# Patient Record
Sex: Female | Born: 1975
Health system: Southern US, Community
[De-identification: ages and names within clinical notes are randomized; demographics above are authoritative.]

## PROBLEM LIST (undated history)

## (undated) DIAGNOSIS — I509 Heart failure, unspecified: Secondary | ICD-10-CM

## (undated) DIAGNOSIS — I471 Supraventricular tachycardia: Secondary | ICD-10-CM

## (undated) HISTORY — DX: Heart failure, unspecified: I50.9

## (undated) HISTORY — DX: Supraventricular tachycardia: I47.1

---

## 1999-08-10 DIAGNOSIS — I509 Heart failure, unspecified: Secondary | ICD-10-CM

## 1999-08-10 HISTORY — DX: Heart failure, unspecified: I50.9

## 2010-11-09 DIAGNOSIS — I471 Supraventricular tachycardia, unspecified: Secondary | ICD-10-CM

## 2010-11-09 HISTORY — DX: Supraventricular tachycardia, unspecified: I47.10

## 2010-11-09 HISTORY — DX: Supraventricular tachycardia: I47.1

## 2016-10-20 NOTE — Progress Notes (Signed)
Cardiology Office Note    Date:  10/22/2016   ID:  Caitlin Cowan, DOB 08/08/1976, MRN [id number]  PCP:  No primary care provider on file.  Cardiologist:  New - previously followed by [facility] in [place] with Dr. Heron NayKhalil   CC: tachycardia  History of Present Illness:  Caitlin Cowan is a 40 y.o. female with a history of SVT, gestational HTN, and postpartum cardiomyopathy (2000) who presents to clinic for evaluation of recurrent SVT.  Last recurrence of SVT occurred in 2012. Seen at [facility] and 2D ECHO at that time was normal. EF 60%. She was not placed on any medications and did not have any more problems until recently. ECGs from that time reviewed which shows SVT.  She does not follow with a regular medical doctor regularly but have seen has been seen over the past two years. She has been told her BP has run high in the past but does not take anything for this.   This past Friday she was reaching to into dryer to do laundry and had episode of palpitations associated with chest tightness and SOB. No dizziness or syncope. She tried a vagal maneuver which helped transiently but came right back. Total of 3 hours.   This Tuesday she had another episode at work which lasted about 30 minutes. 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   She has been exercising over the last couple months with no chest pain or SOB. Has been worried about exercising because she doesn't want to trigger another event.   No LE edema, orthopnea or PND.   Heart disease in paternal GF and uncle (died on MI in 4140s).   Past Medical History:  Diagnosis Date  . CHF (congestive heart failure) (HCC) 08/1999   [facility]  . SVT (supraventricular tachycardia) (HCC) 2012    History reviewed. No pertinent surgical history.  Current Medications: No outpatient prescriptions prior to visit.   No facility-administered medications prior to visit.       Allergies:   Patient has no known allergies.   Social History   Social History  . Marital status: Married    Spouse name: N/A  . Number of children: 2  . Years of education: N/A   Occupational History  . CMA [employer]   Social History Main Topics  . Smoking status: Never Smoker  . Smokeless tobacco: Former NeurosurgeonUser    Quit date: 10/22/2012  . Alcohol use 2.4 oz/week    4 Shots of liquor per week     Comment: scocially  . Drug use: No  . Sexual activity: Yes   Other Topics Concern  . None   Social History Narrative  . None     Family History:  The patient's family history includes Asthma in her brother; Dementia in her paternal grandmother; Diabetes in her father; Heart attack in her paternal grandfather and paternal uncle; Hyperlipidemia in her mother; Migraines in her brother.     ROS:   Please see the history of present illness.    ROS All other systems reviewed and are negative.   PHYSICAL EXAM:   VS:  BP (!) 160/92   Pulse (!) 115   Ht 5\' 1"  (1.549 m)   Wt 151 lb 6 oz (68.7 kg)   LMP 09/28/2016   BMI 28.60 kg/m    GEN: Well nourished, well developed, in no acute distress  HEENT: normal  Neck: no JVD, carotid bruits, or masses Cardiac: RRR; tachy no murmurs, rubs, or gallops,no edema  Respiratory:  clear to auscultation bilaterally, normal work of breathing GI: soft, nontender, nondistended, + BS MS: no deformity or atrophy  Skin: warm and dry, no rash Neuro:  Alert and Oriented x 3, Strength and sensation are intact Psych: euthymic mood, full affect  Wt Readings from Last 3 Encounters:  10/22/16 151 lb 6 oz (68.7 kg)      Studies/Labs Reviewed:   EKG:  EKG is ordered today.  The ekg ordered today demonstrates sinus tachycardia HR 115  Recent Labs: No results found for requested labs within last 8760 hours.   Lipid Panel No results found for: CHOL, TRIG, HDL, CHOLHDL, VLDL, LDLCALC, LDLDIRECT  Additional studies/ records that  were reviewed today include:  [facility]D ECHO 2012 at [facility] Normal LV function EF 60%, no regional wall motion abnormality, no LVH, no diastolic dysfunction. Right ventricular size and function normal. No valvular abnormalities   ASSESSMENT & PLAN:   SVT: she has a long history of this but has done well until recently on no medications. She also likely has undiagnosed HTN. For this reason, I will start her on Cardizem CD 120mg  daily with 30mg  diltiazem PRN for palpitations. Will udpate 2D ECHO and get basic lab work including CBC, BMET and TSH. If she fails conservative measures, I will refer to EP for evaluation for ablation. Dr. Johney Frame discussed plan with me today and agrees.    HTN: BP elevated today. Will start CCB as above and she will keep a log of BP and call us if consistently running over >140/90  Hx of postpartum CM: EF normal by 2D ECHO in 2012. Will update  Sinus tachycardia: she says her HR has run high since her first pregnancy. HR 115 today. Start CCB as above   Medication Adjustments/Labs and Tests Ordered: Current medicines are reviewed at length with the patient today.  Concerns regarding medicines are outlined above.  Medication changes, Labs and Tests ordered today are listed in the Patient Instructions below. Patient Instructions  Medication Instructions:  Your physician has recommended you make the following change in your medication:  1.  START Cardizem 120 mg taking 1 tablet daily 2.  START Diltiazem 30 mg taking 1 tablet daily AS NEEDED FOR PALPITATIONS   Labwork: TODAY:  BMET, CBC, & TSH  Testing/Procedures: Your physician has requested that you have an echocardiogram. Echocardiography is a painless test that uses sound waves to create images of your heart. It provides your doctor with information about the size and shape of your heart and how well your heart's chambers and valves are working. This procedure takes approximately one hour. There are no  restrictions for this procedure.    Follow-Up: Your physician recommends that you schedule a follow-up appointment in: 11/24/16 ARRIVE AT 7:50 FOR AN 8:00 APPT WITH KATIE Caitlin Sato, PA-C   Any Other Special Instructions Will Be Listed Below (If Applicable).  Your physician has requested that you regularly monitor and record your blood pressure readings at home. Please use the same machine at the same time of day to check your readings and record them to bring to your follow-up visit. If it is continuing to run higher than 140/90 please call our office.   Echocardiogram An echocardiogram, or echocardiography, uses sound waves (ultrasound) to produce an image of your heart. The echocardiogram is simple, painless, obtained within a short period of time, and offers valuable  information to your health care provider. The images from an echocardiogram can provide information such as:  Evidence of coronary artery disease (CAD).  Heart size.  Heart muscle function.  Heart valve function.  Aneurysm detection.  Evidence of a past heart attack.  Fluid buildup around the heart.  Heart muscle thickening.  Assess heart valve function. Tell a health care provider about:  Any allergies you have.  All medicines you are taking, including vitamins, herbs, eye drops, creams, and over-the-counter medicines.  Any problems you or family members have had with anesthetic medicines.  Any blood disorders you have.  Any surgeries you have had.  Any medical conditions you have.  Whether you are pregnant or may be pregnant. What happens before the procedure? No special preparation is needed. Eat and drink normally. What happens during the procedure?  In order to produce an image of your heart, gel will be applied to your chest and a wand-like tool (transducer) will be moved over your chest. The gel will help transmit the sound waves from the transducer. The sound waves will harmlessly bounce off  your heart to allow the heart images to be captured in real-time motion. These images will then be recorded.  You may need an IV to receive a medicine that improves the quality of the pictures. What happens after the procedure? You may return to your normal schedule including diet, activities, and medicines, unless your health care provider tells you otherwise. This information is not intended to replace advice given to you by your health care provider. Make sure you discuss any questions you have with your health care provider. Document Released: 10/23/2000 Document Revised: 06/13/2016 Document Reviewed: 07/03/2013 Elsevier Interactive Patient Education  2017 [employer]     If you need a refill on your cardiac medications before your next appointment, please call your pharmacy.      Signed, Caitlin CrockKathryn Mostyn Varnell, PA-C  10/22/2016 9:01 AM    [facility] [address], [place], [place]  [address] Phone: [phone](336) [phone]; Fax: [phone]([phone]

## 2016-10-22 ENCOUNTER — Ambulatory Visit (INDEPENDENT_AMBULATORY_CARE_PROVIDER_SITE_OTHER): Payer: 59 | Admitting: Physician Assistant

## 2016-10-22 ENCOUNTER — Encounter: Payer: Self-pay | Admitting: Physician Assistant

## 2016-10-22 ENCOUNTER — Encounter (INDEPENDENT_AMBULATORY_CARE_PROVIDER_SITE_OTHER): Payer: Self-pay

## 2016-10-22 VITALS — BP 160/92 | HR 115 | Ht 61.0 in | Wt 151.4 lb

## 2016-10-22 DIAGNOSIS — I10 Essential (primary) hypertension: Secondary | ICD-10-CM | POA: Diagnosis not present

## 2016-10-22 DIAGNOSIS — I471 Supraventricular tachycardia: Secondary | ICD-10-CM

## 2016-10-22 DIAGNOSIS — R000 Tachycardia, unspecified: Secondary | ICD-10-CM

## 2016-10-22 DIAGNOSIS — O903 Peripartum cardiomyopathy: Secondary | ICD-10-CM

## 2016-10-22 LAB — CBC
HEMATOCRIT: 41.5 % (ref 35.0–45.0)
Hemoglobin: 14.5 g/dL (ref 11.7–15.5)
MCH: 29.7 pg (ref 27.0–33.0)
MCHC: 34.9 g/dL (ref 32.0–36.0)
MCV: 84.9 fL (ref 80.0–100.0)
MPV: 10.6 fL (ref 7.5–12.5)
PLATELETS: 270 10*3/uL (ref 140–400)
RBC: 4.89 MIL/uL (ref 3.80–5.10)
RDW: 13.9 % (ref 11.0–15.0)
WBC: 6.8 10*3/uL (ref 3.8–10.8)

## 2016-10-22 MED ORDER — DILTIAZEM HCL 30 MG PO TABS: ORAL_TABLET | ORAL | 3 refills | Status: DC

## 2016-10-22 MED ORDER — DILTIAZEM HCL ER COATED BEADS 120 MG PO CP24: 120.0000 mg | ORAL_CAPSULE | Freq: Every day | ORAL | 3 refills | Status: DC

## 2016-10-22 NOTE — Patient Instructions (Addendum)
Medication Instructions:  Your physician has recommended you make the following change in your medication:  1.  START Cardizem 120 mg taking 1 tablet daily 2.  START Diltiazem 30 mg taking 1 tablet daily AS NEEDED FOR PALPITATIONS   Labwork: TODAY:  BMET, CBC, & TSH  Testing/Procedures: Your physician has requested that you have an echocardiogram. Echocardiography is a painless test that uses sound waves to create images of your heart. It provides your doctor with information about the size and shape of your heart and how well your heart's chambers and valves are working. This procedure takes approximately one hour. There are no restrictions for this procedure.    Follow-Up: Your physician recommends that you schedule a follow-up appointment in: 11/24/16 ARRIVE AT 7:50 FOR AN 8:00 APPT WITH KATIE THOMPSON, PA-C   Any Other Special Instructions Will Be Listed Below (If Applicable).  Your physician has requested that you regularly monitor and record your blood pressure readings at home. Please use the same machine at the same time of day to check your readings and record them to bring to your follow-up visit. If it is continuing to run higher than 140/90 please call our office.   Echocardiogram An echocardiogram, or echocardiography, uses sound waves (ultrasound) to produce an image of your heart. The echocardiogram is simple, painless, obtained within a short period of time, and offers valuable information to your health care provider. The images from an echocardiogram can provide information such as:  Evidence of coronary artery disease (CAD).  Heart size.  Heart muscle function.  Heart valve function.  Aneurysm detection.  Evidence of a past heart attack.  Fluid buildup around the heart.  Heart muscle thickening.  Assess heart valve function. Tell a health care provider about:  Any allergies you have.  All medicines you are taking, including vitamins, herbs, eye drops,  creams, and over-the-counter medicines.  Any problems you or family members have had with anesthetic medicines.  Any blood disorders you have.  Any surgeries you have had.  Any medical conditions you have.  Whether you are pregnant or may be pregnant. What happens before the procedure? No special preparation is needed. Eat and drink normally. What happens during the procedure?  In order to produce an image of your heart, gel will be applied to your chest and a wand-like tool (transducer) will be moved over your chest. The gel will help transmit the sound waves from the transducer. The sound waves will harmlessly bounce off your heart to allow the heart images to be captured in real-time motion. These images will then be recorded.  You may need an IV to receive a medicine that improves the quality of the pictures. What happens after the procedure? You may return to your normal schedule including diet, activities, and medicines, unless your health care provider tells you otherwise. This information is not intended to replace advice given to you by your health care provider. Make sure you discuss any questions you have with your health care provider. Document Released: 10/23/2000 Document Revised: 06/13/2016 Document Reviewed: 07/03/2013 Elsevier Interactive Patient Education  2017 [employer]     If you need a refill on your cardiac medications before your next appointment, please call your pharmacy.

## 2016-10-23 NOTE — Addendum Note (Signed)
Addended by: Burnetta SabinWITTY, Kyliana Standen K on: 10/23/2016 12:12 PM   Modules accepted: Orders

## 2016-10-24 LAB — CMP14+CBC/D/PLT+TSH
ALBUMIN: 4.4 g/dL (ref 3.5–5.5)
ALK PHOS: 58 IU/L (ref 39–117)
ALT: 27 IU/L (ref 0–32)
AST: 21 IU/L (ref 0–40)
Albumin/Globulin Ratio: 1.7 (ref 1.2–2.2)
BILIRUBIN TOTAL: 0.3 mg/dL (ref 0.0–1.2)
BUN/Creatinine Ratio: 12 (ref 9–23)
BUN: 9 mg/dL (ref 6–20)
CHLORIDE: 99 mmol/L (ref 96–106)
CO2: 23 mmol/L (ref 18–29)
CREATININE: 0.73 mg/dL (ref 0.57–1.00)
Calcium: 9.8 mg/dL (ref 8.7–10.2)
GFR calc Af Amer: 121 mL/min/{1.73_m2} (ref >59)
GFR, EST NON AFRICAN AMERICAN: 105 mL/min/{1.73_m2} (ref >59)
GLUCOSE: 89 mg/dL (ref 65–99)
Globulin, Total: 2.6 g/dL (ref 1.5–4.5)
POTASSIUM: 4.5 mmol/L (ref 3.5–5.2)
SODIUM: 141 mmol/L (ref 134–144)
TSH: 0.71 u[IU]/mL (ref 0.450–4.500)
Total Protein: 7 g/dL (ref 6.0–8.5)

## 2016-11-11 ENCOUNTER — Other Ambulatory Visit: Payer: Self-pay

## 2016-11-11 ENCOUNTER — Ambulatory Visit (HOSPITAL_COMMUNITY): Payer: 59 | Attending: Cardiovascular Disease

## 2016-11-11 DIAGNOSIS — I471 Supraventricular tachycardia: Secondary | ICD-10-CM | POA: Insufficient documentation

## 2016-11-20 NOTE — Progress Notes (Signed)
Cardiology Office Note    Date:  11/24/2016   ID:  Caitlin Cowan, DOB 04-Mar-1976, MRN [id number]  PCP:  No PCP Per Patient  Cardiologist:  New- will establish with Dr. Johney Frame   CC: follow up SVT.   History of Present Illness:  Caitlin Cowan is a 41 y.o. female with a history of SVT, gestational HTN, and postpartum cardiomyopathy (2000) who presents to clinic for follow up of recurrent SVT.  Has a history of PPM cardiomyopathy. EF normalized to 60% by echo in 2012 (recrods personally reviewed). Last recurrence of SVT occurred in 2012. She was not placed on any medications and did not have any more problems until recently. ECGs from that time reviewed which shows SVT.  I saw her as a new patient on 10/22/16. Her BP was running high. She had had two episodes of SVT. I started her on Cardizem CD 120mg  daily  with 30mg  diltiazem PRN for palpitations. Basic labwork including a TSH was normal. 2D ECHO on 11/11/16 showed normal structure and function of heart (EF 55-60%).   Today she presents to clinic for follow up. Has been following her Bp at home and it has been well controlled. No CP or SOB. No LE edema, orthopnea or PND. No dizziness or syncope. No blood in stool or urine. No palpitations.     Past Medical History:  Diagnosis Date  . CHF (congestive heart failure) (HCC) 08/1999   [facility]  . SVT (supraventricular tachycardia) (HCC) 2012    No past surgical history on file.  Current Medications: Outpatient Medications Prior to Visit  Medication Sig Dispense Refill  . diltiazem (CARDIZEM CD) 120 MG 24 hr capsule Take 1 capsule (120 mg total) by mouth daily. 90 capsule 3  . diltiazem (CARDIZEM) 30 MG tablet Take 1 tablet by mouth daily as needed for palpitations 90 tablet 3   No facility-administered medications prior to visit.      Allergies:   Patient has no known allergies.   Social History   Social History  . Marital status: Married    Spouse name: N/A  . Number of children: 2   . Years of education: N/A   Occupational History  . CMA [employer]   Social History Main Topics  . Smoking status: Never Smoker  . Smokeless tobacco: Former Neurosurgeon    Quit date: 10/22/2012  . Alcohol use 2.4 oz/week    4 Shots of liquor per week     Comment: scocially  . Drug use: No  . Sexual activity: Yes   Other Topics Concern  . Not on file   Social History Narrative  . No narrative on file     Family History:  The patient's family history includes Asthma in her brother; Dementia in her paternal grandmother; Diabetes in her father; Heart attack in her paternal grandfather and paternal uncle; Hyperlipidemia in her mother; Migraines in her brother.      ROS:   Please see the history of present illness.    ROS All other systems reviewed and are negative.   PHYSICAL EXAM:   VS:  BP 140/80   Pulse 80   Ht 5\' 1"  (1.549 m)   Wt 155 lb (70.3 kg)   BMI 29.29 kg/m    GEN: Well nourished, well developed, in no acute distress HEENT: normal Neck: no JVD, carotid bruits, or masses Cardiac: RRR; no murmurs, rubs, or gallops,no edema  Respiratory:  clear to auscultation bilaterally, normal work of breathing  GI: soft, nontender, nondistended, + BS MS: no deformity or atrophy Skin: warm and dry, no rash Neuro:  Alert and Oriented x 3, Strength and sensation are intact Psych: euthymic mood, full affect   Wt Readings from Last 3 Encounters:  11/24/16 155 lb (70.3 kg)  10/22/16 151 lb 6 oz (68.7 kg)      Studies/Labs Reviewed:   EKG:  EKG is NOT ordered today.    Recent Labs: 10/22/2016: ALT 27; BUN 9; Creatinine, Ser 0.73; Hemoglobin 14.5; Platelets CANCELED; Platelets 270; Potassium 4.5; Sodium 141; TSH 0.710   Lipid Panel No results found for: CHOL, TRIG, HDL, CHOLHDL, VLDL, LDLCALC, LDLDIRECT  Additional studies/ records that were reviewed today include:  2D ECHO: 11/11/2016 LV EF: 55% -   60% Study Conclusions - Left ventricle: The cavity size  was normal. Systolic function was normal. The estimated ejection fraction was in the range of 55% to 60%. Wall motion was normal; there were no regional wall motion abnormalities. Left ventricular diastolic function parameters were normal.   ASSESSMENT & PLAN:   SVT: well controlled on Diltiazem CD 120mg  daily.   Hx of post partum CM: resolved. Recent 2D ECHO with normal EF.   HTN: BP is borderline today but she takes it regularly at home and it has been well controlled. No changes today.    Medication Adjustments/Labs and Tests Ordered: Current medicines are reviewed at length with the patient today.  Concerns regarding medicines are outlined above.  Medication changes, Labs and Tests ordered today are listed in the Patient Instructions below. Patient Instructions  Medication Instructions:  Your physician recommends that you continue on your current medications as directed. Please refer to the Current Medication list given to you today.  Labwork: None ordered  Testing/Procedures: None ordered  Follow-Up: Your physician wants you to follow-up in: 9 MONTHS WITH DR. ALLRED.  You will receive a reminder letter in the mail two months in advance. If you don't receive a letter, please call our office to schedule the follow-up appointment.   Any Other Special Instructions Will Be Listed Below (If Applicable).     If you need a refill on your cardiac medications before your next appointment, please call your pharmacy.      Signed, Cline CrockKathryn Mishelle Hassan, PA-C  11/24/2016 8:14 AM    [facility] [address], [place], [place]  [address] Phone: [phone](336) [phone]; Fax: [phone]

## 2016-11-24 ENCOUNTER — Encounter (INDEPENDENT_AMBULATORY_CARE_PROVIDER_SITE_OTHER): Payer: Self-pay

## 2016-11-24 ENCOUNTER — Ambulatory Visit (INDEPENDENT_AMBULATORY_CARE_PROVIDER_SITE_OTHER): Payer: 59 | Admitting: Physician Assistant

## 2016-11-24 VITALS — BP 140/80 | HR 80 | Ht 61.0 in | Wt 155.0 lb

## 2016-11-24 DIAGNOSIS — I10 Essential (primary) hypertension: Secondary | ICD-10-CM

## 2016-11-24 DIAGNOSIS — O903 Peripartum cardiomyopathy: Secondary | ICD-10-CM

## 2016-11-24 DIAGNOSIS — I471 Supraventricular tachycardia: Secondary | ICD-10-CM | POA: Diagnosis not present

## 2016-11-24 NOTE — Patient Instructions (Signed)
Medication Instructions:  Your physician recommends that you continue on your current medications as directed. Please refer to the Current Medication list given to you today.  Labwork: None ordered  Testing/Procedures: None ordered  Follow-Up: Your physician wants you to follow-up in: 9 MONTHS WITH DR. ALLRED.  You will receive a reminder letter in the mail two months in advance. If you don't receive a letter, please call our office to schedule the follow-up appointment.   Any Other Special Instructions Will Be Listed Below (If Applicable).     If you need a refill on your cardiac medications before your next appointment, please call your pharmacy.

## 2017-10-07 ENCOUNTER — Other Ambulatory Visit: Payer: Self-pay | Admitting: Physician Assistant

## 2017-10-07 MED ORDER — DILTIAZEM HCL ER COATED BEADS 120 MG PO CP24: 120.0000 mg | ORAL_CAPSULE | Freq: Every day | ORAL | 0 refills | Status: DC

## 2017-10-11 ENCOUNTER — Other Ambulatory Visit: Payer: Self-pay | Admitting: Physician Assistant

## 2017-10-22 ENCOUNTER — Encounter: Payer: Self-pay | Admitting: Internal Medicine

## 2017-10-22 ENCOUNTER — Encounter (INDEPENDENT_AMBULATORY_CARE_PROVIDER_SITE_OTHER): Payer: Self-pay

## 2017-10-22 ENCOUNTER — Ambulatory Visit (INDEPENDENT_AMBULATORY_CARE_PROVIDER_SITE_OTHER): Payer: 59 | Admitting: Internal Medicine

## 2017-10-22 VITALS — BP 130/86 | HR 90 | Ht 61.0 in | Wt 165.4 lb

## 2017-10-22 DIAGNOSIS — I471 Supraventricular tachycardia: Secondary | ICD-10-CM

## 2017-10-22 DIAGNOSIS — O903 Peripartum cardiomyopathy: Secondary | ICD-10-CM

## 2017-10-22 MED ORDER — DILTIAZEM HCL ER COATED BEADS 120 MG PO CP24: 120.0000 mg | ORAL_CAPSULE | Freq: Every day | ORAL | 3 refills | Status: DC

## 2017-10-22 NOTE — Progress Notes (Signed)
   PCP: Patient, No Pcp Per   Primary EP: Dr Johney FrameAllred  Caitlin Cowan is a 41 y.o. female who presents today for routine electrophysiology followup.  Since last being seen in our clinic, the patient reports doing reasonably well.  She continues to have intermittent episodes of SVT every months, typically lasting an hour or so.  She finds that she will have episodes occasionally with bending over. Today, she denies symptoms of palpitations, chest pain, shortness of breath,  lower extremity edema, dizziness, presyncope, or syncope.  The patient is otherwise without complaint today.   Past Medical History:  Diagnosis Date  . CHF (congestive heart failure) (HCC) 08/1999   [facility]  . SVT (supraventricular tachycardia) (HCC) 2012   No past surgical history on file.  ROS- all systems are reviewed and negatives except as per HPI above  Current Outpatient Medications  Medication Sig Dispense Refill  . diltiazem (CARDIZEM CD) 120 MG 24 hr capsule Take 1 capsule (120 mg total) by mouth daily. 90 capsule 3  . diltiazem (CARDIZEM) 30 MG tablet Take 30 mg by mouth daily as needed (palpitations).     No current facility-administered medications for this visit.     Physical Exam: Vitals:   10/22/17 1527  BP: 130/86  Pulse: 90  SpO2: 98%  Weight: 165 lb 6.4 oz (75 kg)  Height: 5\' 1"  (1.549 m)    GEN- The patient is well appearing, alert and oriented x 3 today.   Head- normocephalic, atraumatic Eyes-  Sclera clear, conjunctiva pink Ears- hearing intact Oropharynx- clear Lungs- Clear to ausculation bilaterally, normal work of breathing Heart- Regular rate and rhythm, no murmurs, rubs or gallops, PMI not laterally displaced GI- soft, NT, ND, + BS Extremities- no clubbing, cyanosis, or edema  EKG tracing ordered today is personally reviewed and shows sinus rhythm 90 bpm, PR 142 msec, incomplete RBBB  Assessment and Plan:  1. SVT The patient has recurrent symptomatic SVT.  She has failed medical  therapy with diltiazem. Therapeutic strategies for supraventricular tachycardia including medicine and ablation were discussed in detail with the patient today. Risk, benefits, and alternatives to EP study and radiofrequency ablation were also discussed in detail today. These risks include but are not limited to stroke, bleeding, vascular damage, tamponade, perforation, damage to the heart and other structures, AV block requiring pacemaker, worsening renal function, and death. The patient understands these risk and wishes to think about it further.  She will contact our office if she decides to proceed.  2. Prior peripartum cardiomyopathy EF has recovered No symptoms  Return in a year She will contact our office if she decides to proceed with ablation in the interim.  Hillis RangeJames Liann Spaeth MD, [facility] 10/22/2017 5:04 PM

## 2017-10-22 NOTE — Patient Instructions (Signed)
Medication Instructions:  Your physician recommends that you continue on your current medications as directed. Please refer to the Current Medication list given to you today.   Labwork: None ordered   Testing/Procedures: None ordered   Follow-Up: Your physician wants you to follow-up in: 12 months with Dr. Johney FrameAllred. You will receive a reminder letter in the mail two months in advance. If you don't receive a letter, please call our office to schedule the follow-up appointment.    Any Other Special Instructions Will Be Listed Below (If Applicable).  Please call Dr. Jenel LucksAllred's nurse, Dennis BastKelly Lanier, RN, if you decide to proceed with an ablation. [phone]   If you need a refill on your cardiac medications before your next appointment, please call your pharmacy.

## 2018-07-14 ENCOUNTER — Other Ambulatory Visit: Payer: Self-pay | Admitting: Obstetrics and Gynecology

## 2018-07-14 DIAGNOSIS — Z1239 Encounter for other screening for malignant neoplasm of breast: Secondary | ICD-10-CM

## 2018-08-16 ENCOUNTER — Ambulatory Visit
Admission: RE | Admit: 2018-08-16 | Discharge: 2018-08-16 | Disposition: A | Discharge status: Home / Self Care | Payer: BLUE CROSS/BLUE SHIELD | Source: Ambulatory Visit | Attending: Obstetrics and Gynecology | Admitting: Obstetrics and Gynecology

## 2018-08-16 DIAGNOSIS — Z1231 Encounter for screening mammogram for malignant neoplasm of breast: Secondary | ICD-10-CM | POA: Diagnosis not present

## 2018-08-16 DIAGNOSIS — Z1239 Encounter for other screening for malignant neoplasm of breast: Secondary | ICD-10-CM

## 2018-10-22 ENCOUNTER — Other Ambulatory Visit: Payer: Self-pay | Admitting: Internal Medicine

## 2018-11-19 ENCOUNTER — Other Ambulatory Visit: Payer: Self-pay | Admitting: Internal Medicine

## 2018-12-09 ENCOUNTER — Other Ambulatory Visit: Payer: Self-pay | Admitting: Internal Medicine

## 2019-01-02 ENCOUNTER — Ambulatory Visit: Payer: BLUE CROSS/BLUE SHIELD | Admitting: Internal Medicine

## 2019-01-02 ENCOUNTER — Encounter: Payer: Self-pay | Admitting: Internal Medicine

## 2019-01-02 VITALS — BP 128/84 | HR 90 | Ht 61.0 in | Wt 170.8 lb

## 2019-01-02 DIAGNOSIS — O903 Peripartum cardiomyopathy: Secondary | ICD-10-CM

## 2019-01-02 DIAGNOSIS — I471 Supraventricular tachycardia: Secondary | ICD-10-CM

## 2019-01-02 MED ORDER — DILTIAZEM HCL 30 MG PO TABS: 30.0000 mg | ORAL_TABLET | Freq: Every day | ORAL | 11 refills | Status: DC | PRN

## 2019-01-02 MED ORDER — DILTIAZEM HCL ER COATED BEADS 120 MG PO CP24: 120.0000 mg | ORAL_CAPSULE | Freq: Every day | ORAL | 3 refills | Status: DC

## 2019-01-02 NOTE — Patient Instructions (Addendum)

## 2019-01-02 NOTE — Progress Notes (Signed)
   PCP: Patient, No Pcp Per   Primary EP: Dr Johney Frame  Caitlin Cowan is a 43 y.o. female who presents today for routine electrophysiology followup.  Since last being seen in our clinic, the patient reports doing very well. She feels that her SVT is infrequent and well controlled.  Today, she denies symptoms of palpitations, chest pain, shortness of breath,  lower extremity edema, dizziness, presyncope, or syncope.  The patient is otherwise without complaint today.   Past Medical History:  Diagnosis Date  . CHF (congestive heart failure) (HCC) 08/1999   [facility]  . SVT (supraventricular tachycardia) (HCC) 2012   History reviewed. No pertinent surgical history.  ROS- all systems are reviewed and negatives except as per HPI above  Current Outpatient Medications  Medication Sig Dispense Refill  . diltiazem (CARDIZEM CD) 120 MG 24 hr capsule Take 1 capsule (120 mg total) by mouth daily. Please keep upcoming appt for future refills. Thank you 30 capsule 0  . diltiazem (CARDIZEM) 30 MG tablet Take 30 mg by mouth daily as needed (palpitations).     No current facility-administered medications for this visit.     Physical Exam: Vitals:   01/02/19 1631  BP: 128/84  Pulse: 90  SpO2: 95%  Weight: 170 lb 12.8 oz (77.5 kg)  Height: 5\' 1"  (1.549 m)    GEN- The patient is well appearing, alert and oriented x 3 today.   Head- normocephalic, atraumatic Eyes-  Sclera clear, conjunctiva pink Ears- hearing intact Oropharynx- clear Lungs- Clear to ausculation bilaterally, normal work of breathing Heart- Regular rate and rhythm, no murmurs, rubs or gallops, PMI not laterally displaced GI- soft, NT, ND, + BS Extremities- no clubbing, cyanosis, or edema  Wt Readings from Last 3 Encounters:  01/02/19 170 lb 12.8 oz (77.5 kg)  10/22/17 165 lb 6.4 oz (75 kg)  11/24/16 155 lb (70.3 kg)    EKG tracing ordered today is personally reviewed and shows sinus rhythm, incomplete RBBB  Assessment and  Plan:  1. SVT Rare episodes which occur with cough, sneezing, or bending She is pleased with medical therapy and remains clear that she is not interested in ablation.   2. Prior peripartum CM EF has normalized No symptoms  Return to see EP PA in a year  Hillis Range MD, [facility] 01/02/2019 4:41 PM

## 2019-01-06 ENCOUNTER — Other Ambulatory Visit: Payer: Self-pay | Admitting: Obstetrics and Gynecology

## 2019-01-06 DIAGNOSIS — N6310 Unspecified lump in the right breast, unspecified quadrant: Secondary | ICD-10-CM

## 2019-01-06 DIAGNOSIS — N6311 Unspecified lump in the right breast, upper outer quadrant: Secondary | ICD-10-CM | POA: Diagnosis not present

## 2019-01-06 DIAGNOSIS — Z01419 Encounter for gynecological examination (general) (routine) without abnormal findings: Secondary | ICD-10-CM | POA: Diagnosis not present

## 2019-01-06 DIAGNOSIS — Z1151 Encounter for screening for human papillomavirus (HPV): Secondary | ICD-10-CM | POA: Diagnosis not present

## 2019-01-06 DIAGNOSIS — N644 Mastodynia: Secondary | ICD-10-CM

## 2019-01-11 ENCOUNTER — Ambulatory Visit
Admission: RE | Admit: 2019-01-11 | Discharge: 2019-01-11 | Disposition: A | Discharge status: Home / Self Care | Payer: BLUE CROSS/BLUE SHIELD | Source: Ambulatory Visit | Attending: Obstetrics and Gynecology | Admitting: Obstetrics and Gynecology

## 2019-01-11 DIAGNOSIS — N6310 Unspecified lump in the right breast, unspecified quadrant: Secondary | ICD-10-CM

## 2019-01-11 DIAGNOSIS — N644 Mastodynia: Secondary | ICD-10-CM

## 2019-05-24 DIAGNOSIS — D2372 Other benign neoplasm of skin of left lower limb, including hip: Secondary | ICD-10-CM | POA: Diagnosis not present

## 2019-09-05 ENCOUNTER — Other Ambulatory Visit: Payer: Self-pay | Admitting: Obstetrics and Gynecology

## 2019-09-05 DIAGNOSIS — Z1231 Encounter for screening mammogram for malignant neoplasm of breast: Secondary | ICD-10-CM

## 2019-09-21 DIAGNOSIS — Z20828 Contact with and (suspected) exposure to other viral communicable diseases: Secondary | ICD-10-CM | POA: Diagnosis not present

## 2019-10-26 ENCOUNTER — Other Ambulatory Visit: Payer: Self-pay

## 2019-10-26 ENCOUNTER — Ambulatory Visit: Payer: BLUE CROSS/BLUE SHIELD

## 2019-10-26 ENCOUNTER — Ambulatory Visit
Admission: RE | Admit: 2019-10-26 | Discharge: 2019-10-26 | Disposition: A | Discharge status: Home / Self Care | Payer: BC Managed Care – PPO | Source: Ambulatory Visit | Attending: Obstetrics and Gynecology | Admitting: Obstetrics and Gynecology

## 2019-10-26 DIAGNOSIS — Z1231 Encounter for screening mammogram for malignant neoplasm of breast: Secondary | ICD-10-CM

## 2019-12-11 ENCOUNTER — Telehealth: Payer: Self-pay

## 2019-12-13 ENCOUNTER — Encounter: Payer: Self-pay | Admitting: Internal Medicine

## 2019-12-13 ENCOUNTER — Telehealth (INDEPENDENT_AMBULATORY_CARE_PROVIDER_SITE_OTHER): Payer: BC Managed Care – PPO | Admitting: Internal Medicine

## 2019-12-13 VITALS — BP 138/90 | HR 100 | Ht 61.0 in | Wt 156.0 lb

## 2019-12-13 DIAGNOSIS — I471 Supraventricular tachycardia: Secondary | ICD-10-CM

## 2019-12-13 NOTE — Progress Notes (Signed)
Electrophysiology TeleHealth Note   Due to national recommendations of social distancing due to COVID 19, an audio/video telehealth visit is felt to be most appropriate for this patient at this time.  See MyChart message from today for the patient's consent to telehealth for [facility].   Date:  12/13/2019   ID:  Abbegayle Donata, [place] [birth year], MRN [id number]  Location: patient's home  Provider location:  [facility]  Evaluation Performed: Follow-up visit  PCP:  Patient, No Pcp Per   Electrophysiologist:  Dr Rayann Heman  Chief Complaint:  palpitations  History of Present Illness:    Rorie Sanaiyah Kirchhoff is a 44 y.o. female who presents via telehealth conferencing today.  Since last being seen in our clinic, the patient reports doing very well.  She continues to have episodes of SVT.  Her last episode was in December after bending over to pick up something.  Episodes last up to 3 hours.  Today, she denies symptoms of chest pain, shortness of breath,  lower extremity edema, dizziness, presyncope, or syncope.  The patient is otherwise without complaint today.  The patient denies symptoms of fevers, chills, cough, or new SOB worrisome for COVID 19.  Past Medical History:  Diagnosis Date  . CHF (congestive heart failure) ([place]) 08/1999   [facility]  . SVT (supraventricular tachycardia) ([place]) 2012    History reviewed. No pertinent surgical history.  Current Outpatient Medications  Medication Sig Dispense Refill  . diltiazem (CARDIZEM CD) 120 MG 24 hr capsule Take 1 capsule (120 mg total) by mouth daily. 90 capsule 3  . diltiazem (CARDIZEM) 30 MG tablet Take 1 tablet (30 mg total) by mouth daily as needed (palpitations). 30 tablet 11   No current facility-administered medications for this visit.    Allergies:   Patient has no known allergies.   Social History:  The patient  reports that she has never smoked. She quit smokeless tobacco use about 7 years ago. She reports current  alcohol use of about 4.0 standard drinks of alcohol per week. She reports that she does not use drugs.   Family History:  The patient's family history includes Asthma in her brother; Dementia in her paternal grandmother; Diabetes in her father; Heart attack in her paternal grandfather and paternal uncle; Hyperlipidemia in her mother; Migraines in her brother.   ROS:  Please see the history of present illness.   All other systems are personally reviewed and negative.    Exam:    Vital Signs:  BP 138/90   Pulse 100   Ht 5\' 1"  (1.549 m)   Wt 156 lb (70.8 kg)   BMI 29.48 kg/m   Well sounding, alert and conversant   Labs/Other Tests and Data Reviewed:    Recent Labs: No results found for requested labs within last 8760 hours.   Wt Readings from Last 3 Encounters:  12/13/19 156 lb (70.8 kg)  01/02/19 170 lb 12.8 oz (77.5 kg)  10/22/17 165 lb 6.4 oz (75 kg)     ASSESSMENT & PLAN:    1.  SVT Therapeutic strategies for supraventricular tachycardia including medicine and ablation were discussed in detail with the patient today. Risk, benefits, and alternatives to EP study and radiofrequency ablation were also discussed in detail today.   She is clear that she does not wish to proceed with ablation at this time. If she decides to proceed, she will contact my office.  Her episodes occur with bending, lifting and are highly suggestive of  AVNRT.  She would be a very good candidate for ablation.    Follow-up:  12 months with Renee   Patient Risk:  after full review of this patients clinical status, I feel that they are at moderate risk at this time.  Today, I have spent 15 minutes with the patient with telehealth technology discussing arrhythmia management .    Randolm Idol, MD  12/13/2019 9:36 AM     [facility] [address] [place] [address] [phone] (office) [phone] (fax)

## 2020-01-24 ENCOUNTER — Other Ambulatory Visit: Payer: Self-pay | Admitting: Internal Medicine

## 2020-03-18 DIAGNOSIS — N83291 Other ovarian cyst, right side: Secondary | ICD-10-CM | POA: Diagnosis not present

## 2020-03-18 DIAGNOSIS — N926 Irregular menstruation, unspecified: Secondary | ICD-10-CM | POA: Diagnosis not present

## 2020-03-18 DIAGNOSIS — N859 Noninflammatory disorder of uterus, unspecified: Secondary | ICD-10-CM | POA: Diagnosis not present

## 2020-03-18 DIAGNOSIS — N939 Abnormal uterine and vaginal bleeding, unspecified: Secondary | ICD-10-CM | POA: Diagnosis not present

## 2020-03-18 DIAGNOSIS — Z3202 Encounter for pregnancy test, result negative: Secondary | ICD-10-CM | POA: Diagnosis not present

## 2020-03-18 DIAGNOSIS — N898 Other specified noninflammatory disorders of vagina: Secondary | ICD-10-CM | POA: Diagnosis not present

## 2020-03-18 DIAGNOSIS — N854 Malposition of uterus: Secondary | ICD-10-CM | POA: Diagnosis not present

## 2020-04-16 DIAGNOSIS — N83291 Other ovarian cyst, right side: Secondary | ICD-10-CM | POA: Diagnosis not present

## 2020-04-16 DIAGNOSIS — N938 Other specified abnormal uterine and vaginal bleeding: Secondary | ICD-10-CM | POA: Diagnosis not present

## 2020-04-16 DIAGNOSIS — N939 Abnormal uterine and vaginal bleeding, unspecified: Secondary | ICD-10-CM | POA: Diagnosis not present

## 2020-04-16 DIAGNOSIS — N83292 Other ovarian cyst, left side: Secondary | ICD-10-CM | POA: Diagnosis not present

## 2020-06-22 DIAGNOSIS — Z20822 Contact with and (suspected) exposure to covid-19: Secondary | ICD-10-CM | POA: Diagnosis not present

## 2020-07-09 DIAGNOSIS — N939 Abnormal uterine and vaginal bleeding, unspecified: Secondary | ICD-10-CM | POA: Diagnosis not present

## 2020-07-09 DIAGNOSIS — N83202 Unspecified ovarian cyst, left side: Secondary | ICD-10-CM | POA: Diagnosis not present

## 2020-07-09 DIAGNOSIS — N83201 Unspecified ovarian cyst, right side: Secondary | ICD-10-CM | POA: Diagnosis not present

## 2020-12-11 ENCOUNTER — Other Ambulatory Visit: Payer: Self-pay | Admitting: Obstetrics and Gynecology

## 2020-12-11 DIAGNOSIS — Z1231 Encounter for screening mammogram for malignant neoplasm of breast: Secondary | ICD-10-CM

## 2020-12-15 NOTE — Progress Notes (Unsigned)
Cardiology Office Note Date:  12/17/2020  Patient ID:  Caitlin Cowan, [place] Me[place] 28-Mar-1976, MRN [id number] PCP:  Patient, No Pcp Per  Electrophysiologist: Dr. Johney Frame    Chief Complaint: annual visit  History of Present Illness: Caitlin Cowan is a 45 y.o. female with history of SVT  She comes in today to be seen for Dr. Johney Frame, last seen by him via telehealth visit Feb 2021.  She continued to have some SVT. Discussed triggers are bending/lifting, highly suggestive AVNRT, felt to be a good candidate for ablation, though the patient preferred not to pursue invasive strategies. Planned for annual visits  TODAY She is doing well. Had an episode of her SVT last week, started on the way home from work. She is aware of the racing, is somewhat anxiety provoking though not overly symptomatic otherwise.  Got home layed down and tried to relax, tried vagal maneuvers, nothing tends to work for her SVT.  She went to lay on the sofa and it stopped. This is the first in a year and a half or longer. Lasted about 1.5hrs.  She exercises on/off, generally feels like she keeps a healthy diet. 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 Checks her BP periodically, similar or better numbers to today's  No CP, SOB, no dizzy spells, near syncope or syncope.  Has a GYN though no PMD and asks if we can get labs today, has not had in a couple years.   Past Medical History:  Diagnosis Date  . CHF (congestive heart failure) (HCC) 08/1999   [facility]  . SVT (supraventricular tachycardia) (HCC) 2012    No past surgical history on file.  Current Outpatient Medications  Medication Sig Dispense Refill  . diltiazem (CARDIZEM CD) 120 MG 24 hr capsule TAKE 1 CAPSULE BY MOUTH EVERY DAY 90 capsule 3  . diltiazem (CARDIZEM) 30 MG tablet Take 1 tablet (30 mg total) by mouth daily as needed (palpitations). 30 tablet 11   No current facility-administered medications for this visit.    Allergies:   Patient has no  known allergies.   Social History:  The patient  reports that she has never smoked. She quit smokeless tobacco use about 8 years ago. She reports current alcohol use of about 4.0 standard drinks of alcohol per week. She reports that she does not use drugs.   Family History:  The patient's family history includes Asthma in her brother; Dementia in her paternal grandmother; Diabetes in her father; Heart attack in her paternal grandfather and paternal uncle; Hyperlipidemia in her mother; Migraines in her brother.  ROS:  Please see the history of present illness.    All other systems are reviewed and otherwise negative.   PHYSICAL EXAM:  VS:  BP 136/80   Pulse 89   Ht 5\' 1"  (1.549 m)   Wt 172 lb (78 kg)   SpO2 98%   BMI 32.50 kg/m  BMI: Body mass index is 32.5 kg/m. Well nourished, well developed, in no acute distress HEENT: normocephalic, atraumatic Neck: no JVD, carotid bruits or masses Cardiac:   RRR; no significant murmurs, no rubs, or gallops Lungs:  CTA b/l, no wheezing, rhonchi or rales Abd: soft, nontender MS: no deformity or atrophy Ext:  no edema Skin: warm and dry, no rash Neuro:  No gross deficits appreciated Psych: euthymic mood, full affect   EKG:  Done today and reviewed by myself shows  SR 89bpm, unchanged   11/11/2016; TTE Study Conclusions  - Left ventricle:  The cavity size was normal. Systolic function was  normal. The estimated ejection fraction was in the range of 55%  to 60%. Wall motion was normal; there were no regional wall  motion abnormalities. Left ventricular diastolic function  parameters were normal.   Recent Labs: No results found for requested labs within last 8760 hours.  No results found for requested labs within last 8760 hours.   CrCl cannot be calculated (Patient's most recent lab result is older than the maximum 21 days allowed.).   Wt Readings from Last 3 Encounters:  12/17/20 172 lb (78 kg)  12/13/19 156 lb (70.8 kg)   01/02/19 170 lb 12.8 oz (77.5 kg)     Other studies reviewed: Additional studies/records reviewed today include: summarized above  ASSESSMENT AND PLAN:  1. SVT     Infrequent episodes, minimally symptomatic  Revisited ablation as an option, for now, with minimal burden, prefers to continue the dilt and avoid invasive procedures.  2. Elevated BP     Discussed exercise, weight loss, minimizing sodium     Usually a bt better when she checks it  3. Heart health     Discussed today     Get labs, she is fasting    Disposition: F/u with annually, sooner if needed  Current medicines are reviewed at length with the patient today.  The patient did not have any concerns regarding medicines.  Caitlin Fredrickson, PA-C 12/17/2020 8:44 AM     [facility] [address] [place] [address] [phone] (office)  [phone] (fax)

## 2020-12-17 ENCOUNTER — Other Ambulatory Visit: Payer: Self-pay

## 2020-12-17 ENCOUNTER — Encounter: Payer: Self-pay | Admitting: Physician Assistant

## 2020-12-17 ENCOUNTER — Ambulatory Visit: Payer: BC Managed Care – PPO | Admitting: Physician Assistant

## 2020-12-17 VITALS — BP 136/80 | HR 89 | Ht 61.0 in | Wt 172.0 lb

## 2020-12-17 DIAGNOSIS — R030 Elevated blood-pressure reading, without diagnosis of hypertension: Secondary | ICD-10-CM

## 2020-12-17 DIAGNOSIS — E785 Hyperlipidemia, unspecified: Secondary | ICD-10-CM | POA: Diagnosis not present

## 2020-12-17 DIAGNOSIS — I471 Supraventricular tachycardia: Secondary | ICD-10-CM | POA: Diagnosis not present

## 2020-12-17 LAB — COMPREHENSIVE METABOLIC PANEL
ALT: 36 IU/L — ABNORMAL HIGH (ref 0–32)
AST: 24 IU/L (ref 0–40)
Albumin/Globulin Ratio: 1.8 (ref 1.2–2.2)
Albumin: 4.3 g/dL (ref 3.8–4.8)
Alkaline Phosphatase: 76 IU/L (ref 44–121)
BUN/Creatinine Ratio: 15 (ref 9–23)
BUN: 10 mg/dL (ref 6–24)
Bilirubin Total: 0.2 mg/dL (ref 0.0–1.2)
CO2: 23 mmol/L (ref 20–29)
Calcium: 9.3 mg/dL (ref 8.7–10.2)
Chloride: 104 mmol/L (ref 96–106)
Creatinine, Ser: 0.65 mg/dL (ref 0.57–1.00)
GFR calc Af Amer: 127 mL/min/{1.73_m2} (ref >59)
GFR calc non Af Amer: 110 mL/min/{1.73_m2} (ref >59)
Globulin, Total: 2.4 g/dL (ref 1.5–4.5)
Glucose: 90 mg/dL (ref 65–99)
Potassium: 4.6 mmol/L (ref 3.5–5.2)
Sodium: 140 mmol/L (ref 134–144)
Total Protein: 6.7 g/dL (ref 6.0–8.5)

## 2020-12-17 LAB — LIPID PANEL
Chol/HDL Ratio: 5 ratio — ABNORMAL HIGH (ref 0.0–4.4)
Cholesterol, Total: 227 mg/dL — ABNORMAL HIGH (ref 100–199)
HDL: 45 mg/dL (ref >39)
LDL Chol Calc (NIH): 161 mg/dL — ABNORMAL HIGH (ref 0–99)
Triglycerides: 117 mg/dL (ref 0–149)
VLDL Cholesterol Cal: 21 mg/dL (ref 5–40)

## 2020-12-17 LAB — CBC
Hematocrit: 37.6 % (ref 34.0–46.6)
Hemoglobin: 12.3 g/dL (ref 11.1–15.9)
MCH: 25.9 pg — ABNORMAL LOW (ref 26.6–33.0)
MCHC: 32.7 g/dL (ref 31.5–35.7)
MCV: 79 fL (ref 79–97)
Platelets: 287 10*3/uL (ref 150–450)
RBC: 4.75 x10E6/uL (ref 3.77–5.28)
RDW: 15.1 % (ref 11.7–15.4)
WBC: 7.5 10*3/uL (ref 3.4–10.8)

## 2020-12-17 MED ORDER — DILTIAZEM HCL 30 MG PO TABS: 30.0000 mg | ORAL_TABLET | Freq: Every day | ORAL | 1 refills | Status: DC | PRN

## 2020-12-17 MED ORDER — DILTIAZEM HCL ER COATED BEADS 120 MG PO CP24: ORAL_CAPSULE | ORAL | 3 refills | Status: DC

## 2020-12-17 NOTE — Patient Instructions (Signed)
Medication Instructions:   Your physician recommends that you continue on your current medications as directed. Please refer to the Current Medication list given to you today.  *If you need a refill on your cardiac medications before your next appointment, please call your pharmacy*   Lab Work:  CMET LIPIDS AND CBC TODAY    If you have labs (blood work) drawn today and your tests are completely normal, you will receive your results only by: Marland Kitchen MyChart Message (if you have MyChart) OR . A paper copy in the mail If you have any lab test that is abnormal or we need to change your treatment, we will call you to review the results.   Testing/Procedures: NONE ORDERED  TODAY   Follow-Up: At [facility], you and your health needs are our priority.  As part of our continuing mission to provide you with exceptional heart care, we have created designated Provider Care Teams.  These Care Teams include your primary Cardiologist (physician) and Advanced Practice Providers (APPs -  Physician Assistants and Nurse Practitioners) who all work together to provide you with the care you need, when you need it.  We recommend signing up for the patient portal called "MyChart".  Sign up information is provided on this After Visit Summary.  MyChart is used to connect with patients for Virtual Visits (Telemedicine).  Patients are able to view lab/test results, encounter notes, upcoming appointments, etc.  Non-urgent messages can be sent to your provider as well.   To learn more about what you can do with MyChart, go to [email or web address].    Your next appointment:   1 year(s)  The format for your next appointment:   In Person  Provider:   You may see Dr. Johney Frame  or one of the following Advanced Practice Providers on your designated Care Team:    Francis Dowse, [place]     Other Instructions

## 2020-12-19 ENCOUNTER — Other Ambulatory Visit: Payer: Self-pay | Admitting: *Deleted

## 2020-12-19 DIAGNOSIS — E7800 Pure hypercholesterolemia, unspecified: Secondary | ICD-10-CM

## 2020-12-19 MED ORDER — ATORVASTATIN CALCIUM 40 MG PO TABS: 40.0000 mg | ORAL_TABLET | Freq: Every day | ORAL | 3 refills | Status: DC

## 2020-12-27 ENCOUNTER — Other Ambulatory Visit: Payer: Self-pay | Admitting: *Deleted

## 2021-01-14 DIAGNOSIS — Z1231 Encounter for screening mammogram for malignant neoplasm of breast: Secondary | ICD-10-CM

## 2021-01-24 ENCOUNTER — Inpatient Hospital Stay: Admission: RE | Admit: 2021-01-24 | Payer: BC Managed Care – PPO | Source: Ambulatory Visit

## 2021-03-18 ENCOUNTER — Other Ambulatory Visit: Payer: Self-pay

## 2021-03-18 ENCOUNTER — Ambulatory Visit
Admission: RE | Admit: 2021-03-18 | Discharge: 2021-03-18 | Disposition: A | Discharge status: Home / Self Care | Payer: BC Managed Care – PPO | Source: Ambulatory Visit | Attending: Obstetrics and Gynecology | Admitting: Obstetrics and Gynecology

## 2021-03-18 DIAGNOSIS — Z1231 Encounter for screening mammogram for malignant neoplasm of breast: Secondary | ICD-10-CM | POA: Diagnosis not present

## 2021-06-19 DIAGNOSIS — N939 Abnormal uterine and vaginal bleeding, unspecified: Secondary | ICD-10-CM | POA: Diagnosis not present

## 2021-06-19 DIAGNOSIS — N926 Irregular menstruation, unspecified: Secondary | ICD-10-CM | POA: Diagnosis not present

## 2021-08-04 DIAGNOSIS — N939 Abnormal uterine and vaginal bleeding, unspecified: Secondary | ICD-10-CM | POA: Diagnosis not present

## 2021-08-04 DIAGNOSIS — Z3202 Encounter for pregnancy test, result negative: Secondary | ICD-10-CM | POA: Diagnosis not present

## 2021-08-04 DIAGNOSIS — N854 Malposition of uterus: Secondary | ICD-10-CM | POA: Diagnosis not present

## 2021-08-04 DIAGNOSIS — N83202 Unspecified ovarian cyst, left side: Secondary | ICD-10-CM | POA: Diagnosis not present

## 2021-10-30 DIAGNOSIS — R509 Fever, unspecified: Secondary | ICD-10-CM | POA: Diagnosis not present

## 2021-10-30 DIAGNOSIS — R051 Acute cough: Secondary | ICD-10-CM | POA: Diagnosis not present

## 2021-10-30 DIAGNOSIS — Z20828 Contact with and (suspected) exposure to other viral communicable diseases: Secondary | ICD-10-CM | POA: Diagnosis not present

## 2021-10-30 DIAGNOSIS — M791 Myalgia, unspecified site: Secondary | ICD-10-CM | POA: Diagnosis not present

## 2021-10-30 DIAGNOSIS — J029 Acute pharyngitis, unspecified: Secondary | ICD-10-CM | POA: Diagnosis not present

## 2022-01-25 ENCOUNTER — Other Ambulatory Visit: Payer: Self-pay | Admitting: Physician Assistant

## 2022-02-09 ENCOUNTER — Other Ambulatory Visit: Payer: Self-pay

## 2022-02-09 ENCOUNTER — Telehealth: Payer: Self-pay | Admitting: Internal Medicine

## 2022-02-09 MED ORDER — DILTIAZEM HCL 30 MG PO TABS: 30.0000 mg | ORAL_TABLET | Freq: Every day | ORAL | 0 refills | Status: DC | PRN

## 2022-02-09 NOTE — Telephone Encounter (Signed)
Pt's medication was sent to pt's pharmacy as requested. Confirmation received.  °

## 2022-02-09 NOTE — Telephone Encounter (Signed)
? ?*  STAT* If patient is at the pharmacy, call can be transferred to refill team. ? ? ?1. Which medications need to be refilled? (please list name of each medication and dose if known)  ? ?diltiazem (CARDIZEM) 30 MG tablet ? ?2. Which pharmacy/location (including street and city if local pharmacy) is medication to be sent to? ? ?[id number] - [place], [place] - [address] ? ?3. Do they need a 30 day or 90 day supply?  90 day ?

## 2022-02-10 ENCOUNTER — Other Ambulatory Visit: Payer: Self-pay | Admitting: Obstetrics and Gynecology

## 2022-02-10 DIAGNOSIS — Z1231 Encounter for screening mammogram for malignant neoplasm of breast: Secondary | ICD-10-CM

## 2022-02-12 ENCOUNTER — Telehealth: Payer: Self-pay | Admitting: Internal Medicine

## 2022-02-12 NOTE — Telephone Encounter (Signed)
? ?*  STAT* If patient is at the pharmacy, call can be transferred to refill team. ? ? ?1. Which medications need to be refilled? (please list name of each medication and dose if known)  ? ?diltiazem (CARDIZEM CD) 120 MG 24 hr capsule ? ?2. Which pharmacy/location (including street and city if local pharmacy) is medication to be sent to? ? ?[id number] - [place], [place] - [address] ? ?3. Do they need a 30 day or 90 day supply? 90  ?

## 2022-02-13 ENCOUNTER — Other Ambulatory Visit: Payer: Self-pay | Admitting: Physician Assistant

## 2022-02-13 MED ORDER — DILTIAZEM HCL ER COATED BEADS 120 MG PO CP24: 120.0000 mg | ORAL_CAPSULE | Freq: Every day | ORAL | 0 refills | Status: DC

## 2022-02-13 NOTE — Telephone Encounter (Signed)
Pt's medication was resent to pt's pharmacy as requested. Confirmation received.  °

## 2022-02-26 NOTE — Progress Notes (Signed)
? ?Cardiology Office Note ?Date:  02/26/2022  ?Patient ID:  Caitlin Cowan, [place] Meo Apr 30, 1976, MRN [id number] ?PCP:  Patient, No Pcp Per (Inactive)  ?Electrophysiologist: Dr. Johney Frame ? ?  ?Chief Complaint:  annual visit ? ?History of Present Illness: ?Caitlin Cowan is a 46 y.o. female with history of SVT ? ?She comes in today to be seen for Dr. Johney Frame, last seen by him via telehealth visit Feb 2021.  She continued to have some SVT. ?Discussed triggers are bending/lifting, highly suggestive AVNRT, felt to be a good candidate for ablation, though the patient preferred not to pursue invasive strategies. ?Planned for annual visits ? ?I saw her Feb 2022 ?She is doing well. Had an episode of her SVT last week, started on the way home from work. She is aware of the racing, is somewhat anxiety provoking though not overly symptomatic otherwise.  Got home layed down and tried to relax, tried vagal maneuvers, nothing tends to work for her SVT.  She went to lay on the sofa and it stopped. ?This is the first in a year and a half or longer. ?Lasted about 1.5hrs. ?She exercises on/off, generally feels like she keeps a healthy diet. ?Works as a Clinical biochemist at a Facilities manager. ?Checks her BP periodically, similar or better numbers to today's ?No CP, SOB, no dizzy spells, near syncope or syncope. ?Has a GYN though no PMD and asks if we can get labs today, has not had in a couple years. ?Was happy with her arrhythmia management, no changes were made.   ? ?Labs were done, LDL high, recommended starting Atorvastatin ?Unclear if she started, did not do f/u labs ? ?TODAY ?She is doing well. 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 ?She has had a few short-lived SVT episodes, has used the PRN dilt with success. ?No cardiac concerns or symptoms. ?She elected not to start the atorvastatin to try lifestyle change efforts 1st. ?She denies any exertional intolerances ? ? ?Past Medical History:  ?Diagnosis Date  ? CHF (congestive heart  failure) (HCC) 08/1999  ? [facility]  ? SVT (supraventricular tachycardia) (HCC) 2012  ? ? ?No past surgical history on file. ? ?Current Outpatient Medications  ?Medication Sig Dispense Refill  ? diltiazem (CARDIZEM CD) 120 MG 24 hr capsule Take 1 capsule (120 mg total) by mouth daily. 90 capsule 0  ? diltiazem (CARDIZEM) 30 MG tablet Take 1 tablet (30 mg total) by mouth daily as needed (palpitations). 90 tablet 0  ? ?No current facility-administered medications for this visit.  ? ? ?Allergies:   Patient has no known allergies.  ? ?Social History:  The patient  reports that she has never smoked. She quit smokeless tobacco use about 9 years ago. She reports current alcohol use of about 4.0 standard drinks per week. She reports that she does not use drugs.  ? ?Family History:  The patient's family history includes Asthma in her brother; Dementia in her paternal grandmother; Diabetes in her father; Heart attack in her paternal grandfather and paternal uncle; Hyperlipidemia in her mother; Migraines in her brother. ? ?ROS:  Please see the history of present illness.    ?All other systems are reviewed and otherwise negative.  ? ?PHYSICAL EXAM:  ?VS:  There were no vitals taken for this visit. BMI: There is no height or weight on file to calculate BMI. ?Well nourished, well developed, in no acute distress ?HEENT: normocephalic, atraumatic ?Neck: no JVD, carotid bruits or masses ?Cardiac:   RRR; no significant  murmurs, no rubs, or gallops ?Lungs:  CTA b/l, no wheezing, rhonchi or rales ?Abd: soft, nontender ?MS: no deformity or atrophy ?Ext:  no edema ?Skin: warm and dry, no rash ?Neuro:  No gross deficits appreciated ?Psych: euthymic mood, full affect ? ? ?EKG:  Done today and reviewed by myself shows  ?SR 94bpm, unchanged ? ? ?11/11/2016; TTE ?Study Conclusions  ?- Left ventricle: The cavity size was normal. Systolic function was  ?  normal. The estimated ejection fraction was in the range of 55%  ?  to 60%. Wall motion was  normal; there were no regional wall  ?  motion abnormalities. Left ventricular diastolic function  ?  parameters were normal.  ? ?Recent Labs: ?No results found for requested labs within last 8760 hours.  ?No results found for requested labs within last 8760 hours.  ? ?CrCl cannot be calculated (Patient's most recent lab result is older than the maximum 21 days allowed.).  ? ?Wt Readings from Last 3 Encounters:  ?12/17/20 172 lb (78 kg)  ?12/13/19 156 lb (70.8 kg)  ?01/02/19 170 lb 12.8 oz (77.5 kg)  ?  ? ?Other studies reviewed: ?Additional studies/records reviewed today include: summarized above ? ?ASSESSMENT AND PLAN: ? ?1. SVT ?    Infrequent episodes, minimally symptomatic ? ?We again revisited ablation as an option, for now, with minimal burden, prefers to continue the dilt and avoid invasive procedures. ? ?2. Elevated BP ?    She checks her BP and typically is better ?    Gets a little nervous coming in ? ?3. Heart health ?    Recommended to continue heart healthy life style ?    F/u lipids/management with her PMD ?     ? ? ? ?Disposition: we will continue to see her annually, sooner if needed  ? ?Current medicines are reviewed at length with the patient today.  The patient did not have any concerns regarding medicines. ? ?Signed, ?Francis Dowse, PA-C ?02/26/2022 4:40 PM    ? ?[facility] ?[address] ?[address] ?[place] [address] ?[phone] (office)  ?[phone] (fax) ? ? ?

## 2022-02-27 ENCOUNTER — Encounter: Payer: Self-pay | Admitting: Physician Assistant

## 2022-02-27 ENCOUNTER — Ambulatory Visit: Payer: 59 | Admitting: Physician Assistant

## 2022-02-27 VITALS — BP 138/98 | HR 94 | Ht 61.0 in | Wt 175.2 lb

## 2022-02-27 DIAGNOSIS — I471 Supraventricular tachycardia: Secondary | ICD-10-CM

## 2022-02-27 NOTE — Patient Instructions (Signed)
Medication Instructions:  ? ? ?Your physician recommends that you continue on your current medications as directed. Please refer to the Current Medication list given to you today. ? ? ?*If you need a refill on your cardiac medications before your next appointment, please call your pharmacy* ? ? ?Lab Work: NONE ORDERED  TODAY ? ? ?If you have labs (blood work) drawn today and your tests are completely normal, you will receive your results only by: ?MyChart Message (if you have MyChart) OR ?A paper copy in the mail ?If you have any lab test that is abnormal or we need to change your treatment, we will call you to review the results. ? ? ?Testing/Procedures: NONE ORDERED  TODAY ? ? ? ? ?Follow-Up: ?At [facility], you and your health needs are our priority.  As part of our continuing mission to provide you with exceptional heart care, we have created designated Provider Care Teams.  These Care Teams include your primary Cardiologist (physician) and Advanced Practice Providers (APPs -  Physician Assistants and Nurse Practitioners) who all work together to provide you with the care you need, when you need it. ? ?We recommend signing up for the patient portal called "MyChart".  Sign up information is provided on this After Visit Summary.  MyChart is used to connect with patients for Virtual Visits (Telemedicine).  Patients are able to view lab/test results, encounter notes, upcoming appointments, etc.  Non-urgent messages can be sent to your provider as well.   ?To learn more about what you can do with MyChart, go to [email or web address].   ? ?Your next appointment:   ?1 year(s) ? ?The format for your next appointment:   ?In Person ? ?Provider:   ?You will see one of the following Advanced Practice Providers on your designated Care Team:   ?Francis Dowse, PA-C ? ? ?Other Instructions ? ? ?Important Information About Sugar ? ? ? ? ?  ?

## 2022-03-20 DIAGNOSIS — Z1231 Encounter for screening mammogram for malignant neoplasm of breast: Secondary | ICD-10-CM

## 2022-03-24 ENCOUNTER — Ambulatory Visit
Admission: RE | Admit: 2022-03-24 | Discharge: 2022-03-24 | Disposition: A | Discharge status: Home / Self Care | Payer: 59 | Source: Ambulatory Visit | Attending: Obstetrics and Gynecology | Admitting: Obstetrics and Gynecology

## 2022-03-24 DIAGNOSIS — Z1231 Encounter for screening mammogram for malignant neoplasm of breast: Secondary | ICD-10-CM | POA: Diagnosis not present

## 2022-05-08 ENCOUNTER — Other Ambulatory Visit: Payer: Self-pay | Admitting: Physician Assistant

## 2022-05-11 ENCOUNTER — Other Ambulatory Visit: Payer: Self-pay | Admitting: Physician Assistant

## 2022-09-21 IMAGING — MG MM DIGITAL SCREENING BILAT W/ TOMO AND CAD
8 series · 8 of 24 positions shown · non-contrast
Comparison: Previous exam(s).

ACR Breast Density Category a: The breast tissue is almost entirely
fatty.

CLINICAL DATA: Screening.

EXAM:
DIGITAL SCREENING BILATERAL MAMMOGRAM WITH TOMOSYNTHESIS AND CAD
TECHNIQUE: Bilateral screening digital craniocaudal and mediolateral oblique
mammograms were obtained. Bilateral screening digital breast
tomosynthesis was performed. The images were evaluated with
computer-aided detection.

[R CC synth-2D]
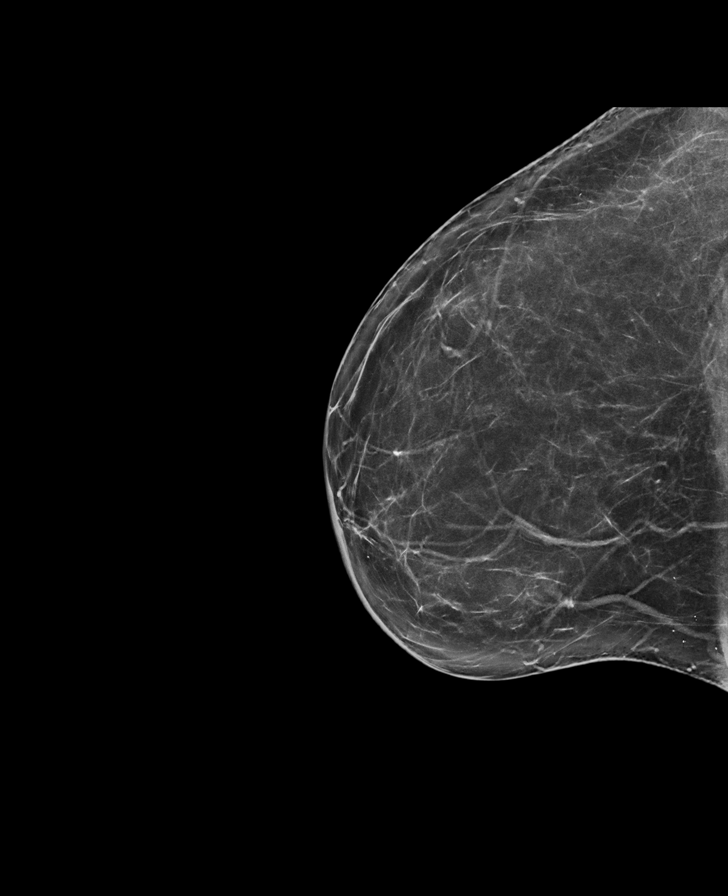

[R MLO synth-2D]
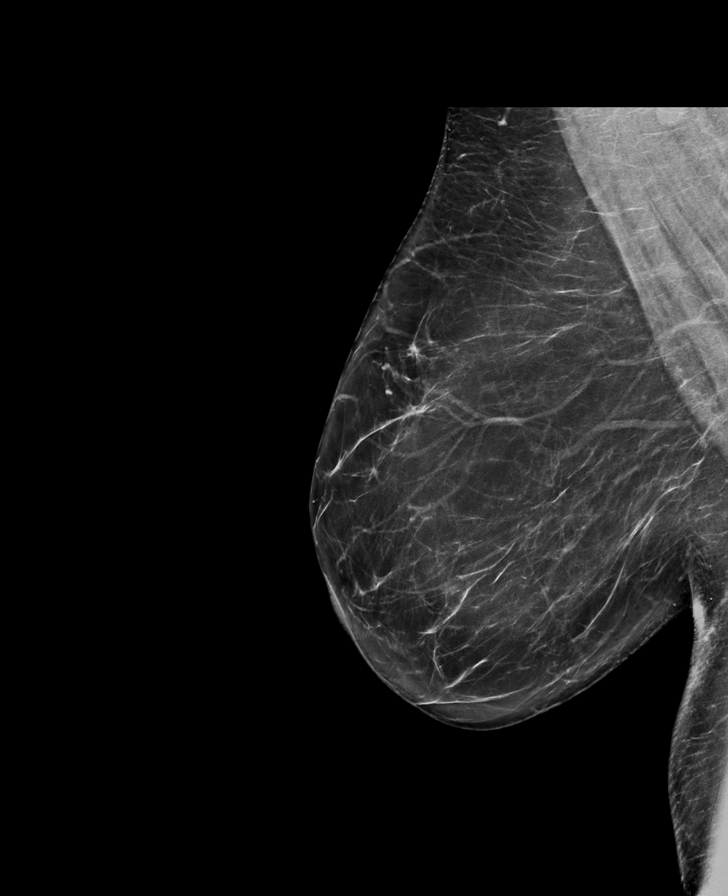

[L CC synth-2D]
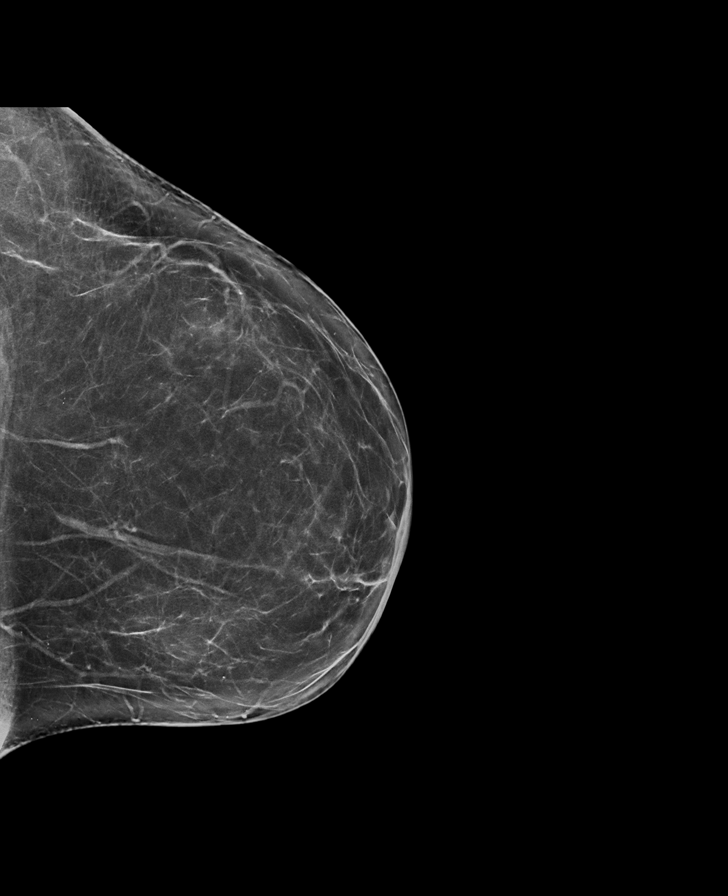

[L MLO synth-2D]
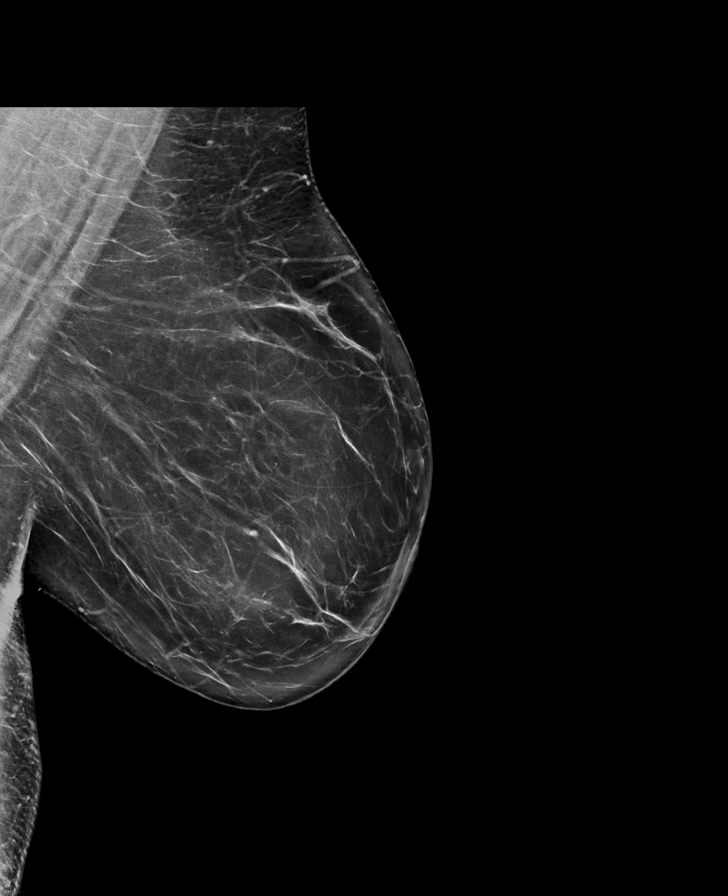

[R MLO tomo · tomo slice 43/85.0]
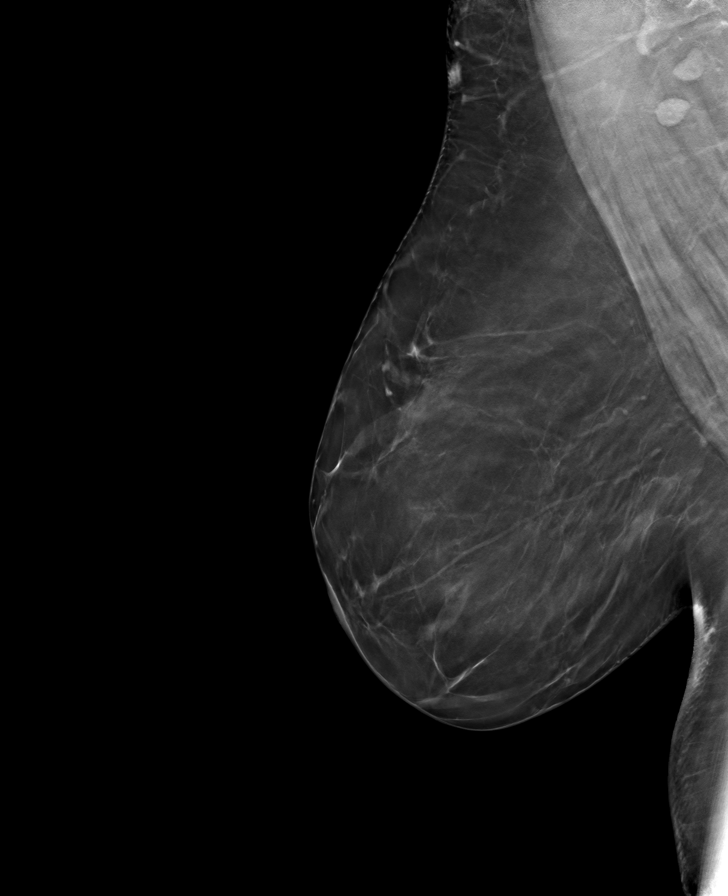

[L MLO tomo · tomo slice 45/88.0]
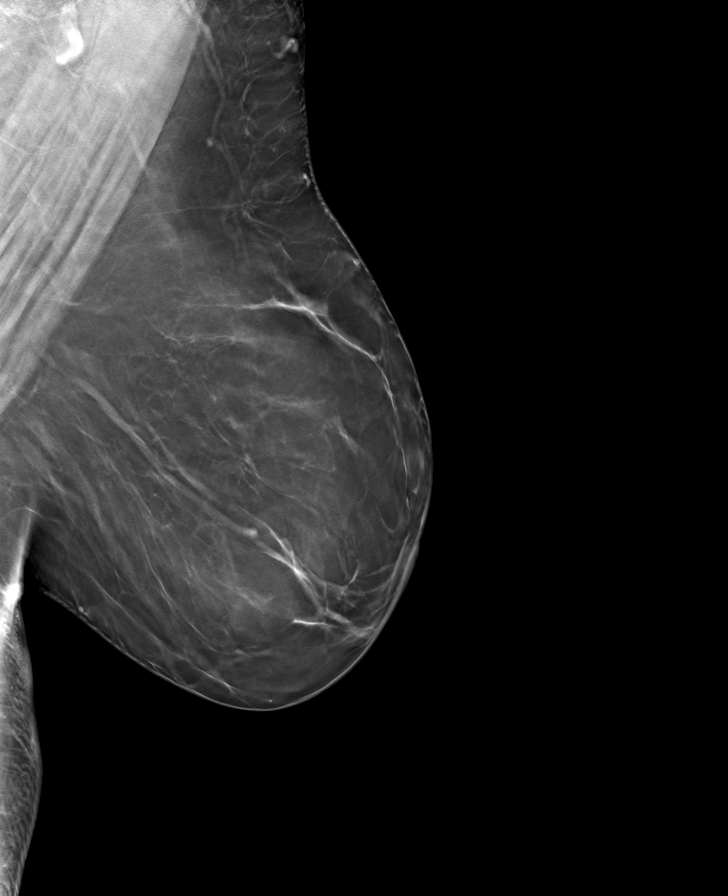

[R CC tomo · tomo slice 38/75.0]
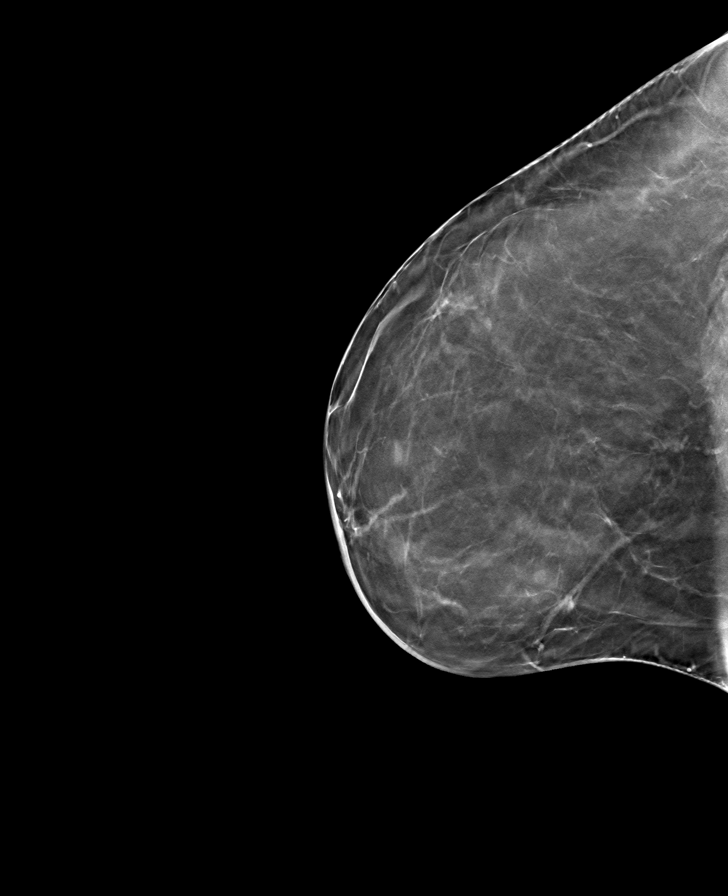

[L CC tomo · tomo slice 41/80.0]
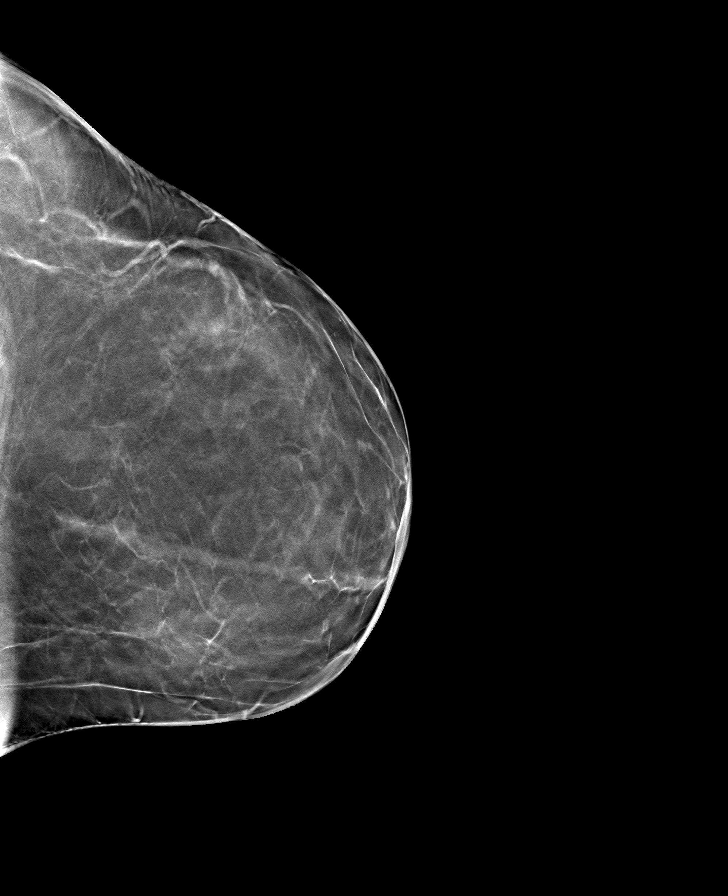

[8 of 24 positions shown; findings below may reference images not displayed]

FINDINGS: There are no findings suspicious for malignancy.
IMPRESSION: No mammographic evidence of malignancy. A result letter of this
screening mammogram will be mailed directly to the patient.

RECOMMENDATION:
Screening mammogram in one year. (Code:0E-3-N98)

BI-RADS CATEGORY  1: Negative.

## 2022-10-29 ENCOUNTER — Other Ambulatory Visit: Payer: Self-pay | Admitting: Internal Medicine

## 2023-01-21 ENCOUNTER — Other Ambulatory Visit: Payer: Self-pay | Admitting: Cardiovascular Disease

## 2023-03-04 ENCOUNTER — Other Ambulatory Visit: Payer: Self-pay | Admitting: Physician Assistant

## 2023-03-14 NOTE — Progress Notes (Unsigned)
Cardiology Office Note Date:  03/14/2023  Patient ID:  Caitlin Cowan, [place] Meo July 27, 1976, MRN [id number] PCP:  Patient, No Pcp Per  Electrophysiologist: Dr. Johney Frame    Chief Complaint:  *** annual visit  History of Present Illness: Caitlin Cowan is a 47 y.o. female with history of SVT  She comes in today to be seen for Dr. Johney Frame, last seen by him via telehealth visit Feb 2021.  She continued to have some SVT. Discussed triggers are bending/lifting, highly suggestive AVNRT, felt to be a good candidate for ablation, though the patient preferred not to pursue invasive strategies. Planned for annual visits  I saw her Feb 2022 She is doing well. Had an episode of her SVT last week, started on the way home from work. She is aware of the racing, is somewhat anxiety provoking though not overly symptomatic otherwise.  Got home layed down and tried to relax, tried vagal maneuvers, nothing tends to work for her SVT.  She went to lay on the sofa and it stopped. This is the first in a year and a half or longer. Lasted about 1.5hrs. She exercises on/off, generally feels like she keeps a healthy diet. 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 Checks her BP periodically, similar or better numbers to today's No CP, SOB, no dizzy spells, near syncope or syncope. Has a GYN though no PMD and asks if we can get labs today, has not had in a couple years. Was happy with her arrhythmia management, no changes were made.    Labs were done, LDL high, recommended starting Atorvastatin Unclear if she started, did not do f/u labs  I saw her 02/27/22 She is doing well. 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 She has had a few short-lived SVT episodes, has used the PRN dilt with success. No cardiac concerns or symptoms. She elected not to start the atorvastatin to try lifestyle change efforts 1st. She denies any exertional intolerances No changes made  *** new EP MD *** symptoms, burden *** labs,  lipids...   Past Medical History:  Diagnosis Date   CHF (congestive heart failure) (HCC) 08/1999   [facility]   SVT (supraventricular tachycardia) (HCC) 2012    No past surgical history on file.  Current Outpatient Medications  Medication Sig Dispense Refill   diltiazem (CARDIZEM CD) 120 MG 24 hr capsule Take 1 capsule (120 mg total) by mouth daily. Pt needs to keep appt for any future refills. 30 capsule 0   diltiazem (CARDIZEM) 30 MG tablet TAKE 1 TABLET (30 MG TOTAL) BY MOUTH DAILY AS NEEDED (PALPITATIONS). 90 tablet 2   medroxyPROGESTERone (PROVERA) 10 MG tablet Take 10 mg by mouth daily.     No current facility-administered medications for this visit.    Allergies:   Patient has no known allergies.   Social History:  The patient  reports that she has never smoked. She quit smokeless tobacco use about 10 years ago. She reports current alcohol use of about 4.0 standard drinks of alcohol per week. She reports that she does not use drugs.   Family History:  The patient's family history includes Asthma in her brother; Dementia in her paternal grandmother; Diabetes in her father; Heart attack in her paternal grandfather and paternal uncle; Hyperlipidemia in her mother; Migraines in her brother.  ROS:  Please see the history of present illness.    All other systems are reviewed and otherwise negative.   PHYSICAL EXAM:  VS:  There were no vitals taken for this visit. BMI: There is no height or weight on file to calculate BMI. Well nourished, well developed, in no acute distress HEENT: normocephalic, atraumatic Neck: no JVD, carotid bruits or masses Cardiac: ***  RRR; no significant murmurs, no rubs, or gallops Lungs: *** CTA b/l, no wheezing, rhonchi or rales Abd: soft, nontender MS: no deformity or atrophy Ext: *** no edema Skin: warm and dry, no rash Neuro:  No gross deficits appreciated Psych: euthymic mood, full affect   EKG:  Done today and reviewed by myself shows   ***   11/11/2016; TTE Study Conclusions  - Left ventricle: The cavity size was normal. Systolic function was    normal. The estimated ejection fraction was in the range of 55%    to 60%. Wall motion was normal; there were no regional wall    motion abnormalities. Left ventricular diastolic function    parameters were normal.   Recent Labs: No results found for requested labs within last 365 days.  No results found for requested labs within last 365 days.   CrCl cannot be calculated (Patient's most recent lab result is older than the maximum 21 days allowed.).   Wt Readings from Last 3 Encounters:  02/27/22 175 lb 3.2 oz (79.5 kg)  12/17/20 172 lb (78 kg)  12/13/19 156 lb (70.8 kg)     Other studies reviewed: Additional studies/records reviewed today include: summarized above  ASSESSMENT AND PLAN:  1. SVT     *** Infrequent episodes, minimally symptomatic  *** We again revisited ablation as an option, for now, with minimal burden, prefers to continue the dilt and avoid invasive procedures.  2. Elevated BP     *** She checks her BP and typically is better     Gets a little nervous coming in  3. Heart health     *** Recommended to continue heart healthy life style     F/u lipids/management with her PMD         Disposition: ***  Current medicines are reviewed at length with the patient today.  The patient did not have any concerns regarding medicines.  Norma Fredrickson, PA-C 03/14/2023 9:46 AM     [facility] [address] [place] [address] [phone] (office)  [phone] (fax)

## 2023-03-16 ENCOUNTER — Encounter: Payer: Self-pay | Admitting: Physician Assistant

## 2023-03-16 ENCOUNTER — Ambulatory Visit: Payer: 59 | Attending: Physician Assistant | Admitting: Physician Assistant

## 2023-03-16 VITALS — BP 138/94 | HR 89 | Ht 61.0 in | Wt 177.8 lb

## 2023-03-16 DIAGNOSIS — I471 Supraventricular tachycardia, unspecified: Secondary | ICD-10-CM

## 2023-03-16 DIAGNOSIS — I5021 Acute systolic (congestive) heart failure: Secondary | ICD-10-CM | POA: Diagnosis not present

## 2023-03-16 MED ORDER — DILTIAZEM HCL ER COATED BEADS 120 MG PO CP24: 120.0000 mg | ORAL_CAPSULE | Freq: Every day | ORAL | 3 refills | Status: DC

## 2023-03-16 MED ORDER — DILTIAZEM HCL 30 MG PO TABS: 30.0000 mg | ORAL_TABLET | Freq: Every day | ORAL | 0 refills | Status: AC | PRN

## 2023-03-16 NOTE — Patient Instructions (Signed)
Medication Instructions:   Your physician recommends that you continue on your current medications as directed. Please refer to the Current Medication list given to you today.  *If you need a refill on your cardiac medications before your next appointment, please call your pharmacy*   Lab Work:  NONE ORDERED  TODAY   If you have labs (blood work) drawn today and your tests are completely normal, you will receive your results only by: MyChart Message (if you have MyChart) OR A paper copy in the mail If you have any lab test that is abnormal or we need to change your treatment, we will call you to review the results.   Testing/Procedures: .NONE ORDERED  TODAY     Follow-Up: At [facility], you and your health needs are our priority.  As part of our continuing mission to provide you with exceptional heart care, we have created designated Provider Care Teams.  These Care Teams include your primary Cardiologist (physician) and Advanced Practice Providers (APPs -  Physician Assistants and Nurse Practitioners) who all work together to provide you with the care you need, when you need it.  We recommend signing up for the patient portal called "MyChart".  Sign up information is provided on this After Visit Summary.  MyChart is used to connect with patients for Virtual Visits (Telemedicine).  Patients are able to view lab/test results, encounter notes, upcoming appointments, etc.  Non-urgent messages can be sent to your provider as well.   To learn more about what you can do with MyChart, go to [email or web address].    Your next appointment:   1 year(s)  Provider:   Cameron Lambert, MD    Other Instructions  

## 2023-03-17 NOTE — Addendum Note (Signed)
Addended byFerne Reus on: 03/17/2023 09:00 AM   Modules accepted: Orders

## 2024-02-11 ENCOUNTER — Telehealth: Payer: Self-pay | Admitting: Physician Assistant

## 2024-02-11 MED ORDER — DILTIAZEM HCL ER COATED BEADS 120 MG PO CP24: 120.0000 mg | ORAL_CAPSULE | Freq: Every day | ORAL | 0 refills | Status: AC

## 2024-02-11 NOTE — Telephone Encounter (Signed)
 Pt's medication was sent to pt's pharmacy as requested. Confirmation received.

## 2024-02-11 NOTE — Telephone Encounter (Signed)
*  STAT* If patient is at the pharmacy, call can be transferred to refill team.   1. Which medications need to be refilled? (please list name of each medication and dose if known)  diltiazem (CARDIZEM CD) 120 MG 24 hr capsule  2. Which pharmacy/location (including street and city if local pharmacy) is medication to be sent to? [id number] - [place], [place] - [address]  3. Do they need a 30 day or 90 day supply?   30 day supply  Patient says with her new insurance she needed a provider who will be in network and she has and appointment scheduled with another cardiologist in 2 weeks. She just needs enough medication to last her.
